# Patient Record
Sex: Female | Born: 2000 | Race: Black or African American | Hispanic: No | Marital: Single | Smoking: Never smoker
Health system: Southern US, Community
[De-identification: ages and names within clinical notes are randomized; demographics above are authoritative.]

## PROBLEM LIST (undated history)

## (undated) DIAGNOSIS — H539 Unspecified visual disturbance: Secondary | ICD-10-CM

## (undated) DIAGNOSIS — F902 Attention-deficit hyperactivity disorder, combined type: Secondary | ICD-10-CM

## (undated) DIAGNOSIS — F419 Anxiety disorder, unspecified: Secondary | ICD-10-CM

## (undated) DIAGNOSIS — F3160 Bipolar disorder, current episode mixed, unspecified: Secondary | ICD-10-CM

## (undated) DIAGNOSIS — F32A Depression, unspecified: Secondary | ICD-10-CM

## (undated) DIAGNOSIS — F329 Major depressive disorder, single episode, unspecified: Secondary | ICD-10-CM

---
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
## 2024-06-21 ENCOUNTER — Ambulatory Visit (HOSPITAL_COMMUNITY)
Admission: EM | Admit: 2024-06-21 | Discharge: 2024-06-22 | Disposition: A | Discharge status: Home / Self Care | Attending: Student in an Organized Health Care Education/Training Program | Admitting: Student in an Organized Health Care Education/Training Program

## 2024-06-21 DIAGNOSIS — F330 Major depressive disorder, recurrent, mild: Secondary | ICD-10-CM

## 2024-06-21 DIAGNOSIS — Z79899 Other long term (current) drug therapy: Secondary | ICD-10-CM | POA: Insufficient documentation

## 2024-06-21 DIAGNOSIS — F331 Major depressive disorder, recurrent, moderate: Secondary | ICD-10-CM | POA: Insufficient documentation

## 2024-06-21 DIAGNOSIS — Z760 Encounter for issue of repeat prescription: Secondary | ICD-10-CM | POA: Insufficient documentation

## 2024-06-21 DIAGNOSIS — Z5900 Homelessness unspecified: Secondary | ICD-10-CM | POA: Insufficient documentation
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
## 2024-06-22 ENCOUNTER — Emergency Department (HOSPITAL_COMMUNITY)
Admission: EM | Admit: 2024-06-22 | Discharge: 2024-06-22 | Disposition: A | Discharge status: Home / Self Care | Payer: Self-pay | Attending: Emergency Medicine | Admitting: Emergency Medicine

## 2024-06-22 ENCOUNTER — Encounter (HOSPITAL_COMMUNITY): Payer: Self-pay

## 2024-06-22 ENCOUNTER — Other Ambulatory Visit: Payer: Self-pay

## 2024-06-22 DIAGNOSIS — F4321 Adjustment disorder with depressed mood: Secondary | ICD-10-CM

## 2024-06-22 DIAGNOSIS — E876 Hypokalemia: Secondary | ICD-10-CM | POA: Insufficient documentation

## 2024-06-22 DIAGNOSIS — R45851 Suicidal ideations: Secondary | ICD-10-CM | POA: Diagnosis not present

## 2024-06-22 LAB — I-STAT CHEM 8, ED
BUN: 6 mg/dL (ref 6–20)
Calcium, Ion: 1.16 mmol/L (ref 1.15–1.40)
Chloride: 102 mmol/L (ref 98–111)
Creatinine, Ser: 1.1 mg/dL — ABNORMAL HIGH (ref 0.44–1.00)
Glucose, Bld: 103 mg/dL — ABNORMAL HIGH (ref 70–99)
HCT: 43 % (ref 36.0–46.0)
Hemoglobin: 14.6 g/dL (ref 12.0–15.0)
Potassium: 3 mmol/L — ABNORMAL LOW (ref 3.5–5.1)
Sodium: 140 mmol/L (ref 135–145)
TCO2: 21 mmol/L — ABNORMAL LOW (ref 22–32)

## 2024-06-22 LAB — COMPREHENSIVE METABOLIC PANEL WITH GFR
ALT: 14 U/L (ref 0–44)
AST: 25 U/L (ref 15–41)
Albumin: 4 g/dL (ref 3.5–5.0)
Alkaline Phosphatase: 86 U/L (ref 38–126)
Anion gap: 14 (ref 5–15)
BUN: 7 mg/dL (ref 6–20)
CO2: 20 mmol/L — ABNORMAL LOW (ref 22–32)
Calcium: 9.2 mg/dL (ref 8.9–10.3)
Chloride: 102 mmol/L (ref 98–111)
Creatinine, Ser: 1.05 mg/dL — ABNORMAL HIGH (ref 0.44–1.00)
GFR, Estimated: >60 mL/min (ref >60)
Glucose, Bld: 101 mg/dL — ABNORMAL HIGH (ref 70–99)
Potassium: 3.2 mmol/L — ABNORMAL LOW (ref 3.5–5.1)
Sodium: 136 mmol/L (ref 135–145)
Total Bilirubin: 1.1 mg/dL (ref 0.0–1.2)
Total Protein: 7.4 g/dL (ref 6.5–8.1)

## 2024-06-22 LAB — CBC
HCT: 41 % (ref 36.0–46.0)
Hemoglobin: 13.7 g/dL (ref 12.0–15.0)
MCH: 30.6 pg (ref 26.0–34.0)
MCHC: 33.4 g/dL (ref 30.0–36.0)
MCV: 91.7 fL (ref 80.0–100.0)
Platelets: 208 K/uL (ref 150–400)
RBC: 4.47 MIL/uL (ref 3.87–5.11)
RDW: 13.8 % (ref 11.5–15.5)
WBC: 9.4 K/uL (ref 4.0–10.5)
nRBC: 0 % (ref 0.0–0.2)

## 2024-06-22 LAB — RAPID URINE DRUG SCREEN, HOSP PERFORMED
Amphetamines: NOT DETECTED
Barbiturates: NOT DETECTED
Benzodiazepines: NOT DETECTED
Cocaine: NOT DETECTED
Opiates: NOT DETECTED
Tetrahydrocannabinol: NOT DETECTED

## 2024-06-22 LAB — HCG, SERUM, QUALITATIVE: Preg, Serum: NEGATIVE

## 2024-06-22 LAB — ETHANOL: Alcohol, Ethyl (B): <15 mg/dL (ref <15)

## 2024-06-22 NOTE — ED Notes (Signed)
 Pt changed into paper scrubs and non-skid socks. All patients belongings inventoried and moved to locker 4 in purple zone. Pt wanded by security in triage. Pt remains in triage room 2 at this time.

## 2024-06-22 NOTE — ED Notes (Signed)
 [employer] called to transport pt to a shelter. Dc instructions provided to pt.

## 2024-06-22 NOTE — Consult Note (Signed)
 [facility] Psychiatric Consult Initial  Patient Name: .Beth Riley  MRN: [id number]  DOB: Jul 27, 2001  Consult Order details:  Orders (From admission, onward)     Start     Ordered   06/22/24 0622  CONSULT TO CALL ACT TEAM       Ordering Provider: Bobette Pleasant SAUNDERS, PA-C  Provider:  (Not yet assigned)  Question:  Reason for Consult?  Answer:  suicidal   06/22/24 0621             Mode of Visit: In person    Psychiatry Consult Evaluation  Service Date: June 22, 2024 LOS:  LOS: 0 days  Chief Complaint I fell alone and no one was helping me  Primary Psychiatric Diagnoses  Adjustment disorder with depressed mood 2.  Suicidal ideations   Assessment  Beth A Schuermann is a 23 y.o. female admitted: Presented to the EDfor 06/22/2024  2:59 AM for suicidal ideations in the context of homelessness. She carries the psychiatric diagnoses of bipolar 1 disorder, depression, anxiety, and ADHD and no significant medical history.  Her current presentation of depression and suicidal ideations in the context of homelessness is most consistent with adjustment disorder with depressed mood. She does not meets criteria for inpatient psychiatric admission based on current presentation.  She currently takes no outpatient psychiatric medicines and she has no psychiatric services in place  Of note patient presented to [facility] 06/21/2024 with similar presentation and was discharged with resources as she did not meet criteria for inpatient psychiatric admission.  On initial examination, patient patient is observed laying in her bed awake.  She is pleasant and cooperative.  She has normal speech and behavior.  She expresses her frustration that she has been homeless for the past few months.  She has been staying back-and-forth at different family members houses and shelters.  Last night she was unable to find anywhere to go and began to feel alone.  She began to have intermittent suicidal ideations  with no intent or plan.  She is currently denying any suicidal or homicidal ideations.  She is able to verbally contract for safety.  She has no access to firearms/weapons.  She is currently denying any depression or anxiety.  She has a euthymic affect.  Reports she only feels depressed and suicidal when she has nowhere to stay.  She is requesting admission to be restarted back on her psychiatric medications which she has not taken in quite some time.  She also wants help switching over her Medicaid because it is currently out of [place] and she is requesting help with housing.  Explained multiple outpatient resources with patient and she verbalized understanding. She is denying any homicidal ideations or auditory/visual hallucinations.  She does not appear manic, psychotic, delusional, or paranoid.  She does not appear to be responding to internal/external stimuli.  Patient initially presented reporting suicidal ideation in the context of homelessness.  On reassessment patient had denies suicidal thoughts, plan, or intent.  Patient expresses primary concern related to housing instability and request community resources.  No evidence of acute psychiatric emergency identified at this time.  Patient does not meet criteria for inpatient psychiatric admission.  Resources  provided and patient is psychiatrically cleared for discharge.  Please see plan below for detailed recommendations.   Diagnoses:  Active Hospital problems: Principal Problem:   Adjustment disorder with depressed mood Active Problems:   Suicidal ideation    Plan   ## Psychiatric Medication Recommendations:  -None  at this time  ## Medical Decision Making Capacity: Not specifically addressed in this encounter  ## Further Work-up:  -- None at this time  -- most recent EKG on 11/09/2016 had QtC of 429 -- Pertinent labwork reviewed earlier this admission includes: C MP glucose, BAL<15, UDS negative, CBC,Pregnancy negative,  ##  Disposition:-- There are no psychiatric contraindications to discharge at this time  ## Behavioral / Environmental: - No specific recommendations at this time.     ## Safety and Observation Level:  - Based on my clinical evaluation, I estimate the patient to be at low risk of self harm in the current setting. - At this time, we recommend  routine. This decision is based on my review of the chart including patient's history and current presentation, interview of the patient, mental status examination, and consideration of suicide risk including evaluating suicidal ideation, plan, intent, suicidal or self-harm behaviors, risk factors, and protective factors. This judgment is based on our ability to directly address suicide risk, implement suicide prevention strategies, and develop a safety plan while the patient is in the clinical setting. Please contact our team if there is a concern that risk level has changed.  CSSR Risk Category:C-SSRS RISK CATEGORY: High Risk  Suicide Risk Assessment: Patient has following modifiable risk factors for suicide: medication noncompliance and triggering events, which we are addressing by recommending outpatient follow-up with medication management and therapy. . Patient has following non-modifiable or demographic risk factors for suicide: history of suicide attempt, history of self harm behavior, and psychiatric hospitalization Patient has the following protective factors against suicide: Access to outpatient mental health care and Frustration tolerance  Thank you for this consult request. Recommendations have been communicated to the primary team.  We will recommend psychiatric outpatient follow-up at this time.   Elveria VEAR Batter, NP       History of Present Illness  Relevant Aspects of Hospital ED Course:  Admitted on 06/22/2024 for suicidal ideations in the context of homelessness. She carries the psychiatric diagnoses of bipolar 1 disorder, depression,  anxiety, and ADHD and no significant medical history.  Patient Report:  I fell alone and no one was helping me  Rebeka Sponseller PA-C, Jeannette A Kraemer is a 23 y.o. female who states that she is in a unpredictable situation where she is not able to get any help.  Patient will not expound on this at this provider.  States that she feels hopeless like there is no point for living.  Does have a history of suicide attempt and ideation in the past.  Will not endorse a plan to this provider at this time, denies homicidality or AVH.  History of bipolar 1 and GAD.  She is not taking her fluoxetine or hydroxyzine as prescribed per patient.  I do not see either of these medications listed in her medical record, but last confirmed medications in our system were in 2018.   RN TRIAGE NOTE, Patient states she started feeling suicidal a few hours a go. State she is in a situation where she feels like she cannot get any help. Patient states there are a lot of weird things going on. She tried to contact the police but they have not helped her. Patient has had suicidal thought in the pass.  Psych ROS:  Depression: Currently is denying any depression- Anxiety: Currently denying any depression Mania (lifetime and current): Endorses Psychosis: (lifetime and current): Unsure  Collateral information:  None  Review of Systems  Constitutional:  Negative  for chills and fever.  Respiratory:  Negative for cough.   Gastrointestinal:  Negative for nausea and vomiting.  Neurological:  Negative for tremors and seizures.  Psychiatric/Behavioral: Negative.       Psychiatric and Social History  Psychiatric History:  Information collected from chart review and patient  Prev Dx/Sx: Bipolar 1 and depression, ADHD, anxiety Current Psych Provider: None Home Meds (current): None has been off medicines for quite some time Previous Med Trials: Risperdal , Haldol, Cogentin , Wellbutrin , Prozac, and Zyprexa  Therapy: None  in place  Prior Psych Hospitalization: Multiple psychiatric admissions as an adolescent Prior Self Harm: History of self-harm/cutting has not been 17 months-history of SI As adolescent. Prior Violence: Denies  Family Psych History: Mother has bipolar disorder and alcoholism Family Hx suicide: Denies  Social History:  Developmental Hx: Denies Educational Hx: 12th grade Occupational Hx: Unemployed Legal Hx: Denies Living Situation: Homeless for months has been living with different family members but yesterday she could find no one to stay with Spiritual Hx: Denies Access to weapons/lethal means: Denies  Substance History Denies all substance use Exam Findings  Physical Exam:  Vital Signs:  Temp:  [97.8 F (36.6 C)-98.6 F (37 C)] 97.9 F (36.6 C) (09/14 0918) Pulse Rate:  [85-96] 85 (09/14 0918) Resp:  [16-21] 18 (09/14 0918) BP: (120-128)/(85-93) 120/85 (09/14 0918) SpO2:  [98 %-100 %] 100 % (09/14 0918) Blood pressure 120/85, pulse 85, temperature 97.9 F (36.6 C), temperature source Oral, resp. rate 18, height 5' 2 (1.575 m), last menstrual period 06/17/2024, SpO2 100%. There is no height or weight on file to calculate BMI.  Physical Exam Constitutional:      Appearance: Normal appearance.  Pulmonary:     Effort: Pulmonary effort is normal. No respiratory distress.  Musculoskeletal:     Cervical back: Normal range of motion.  Neurological:     Mental Status: She is alert and oriented to person, place, and time.  Psychiatric:        Attention and Perception: Attention and perception normal.        Mood and Affect: Mood and affect normal.        Speech: Speech normal.        Behavior: Behavior normal. Behavior is cooperative.        Thought Content: Thought content normal.        Cognition and Memory: Cognition normal.        Judgment: Judgment normal.     Mental Status Exam: General Appearance: Casual  Orientation:  Full (Time, Place, and Person)  Memory:   Immediate;   Good Recent;   Good Remote;   Good  Concentration:  Concentration: Good and Attention Span: Good  Recall:  Good  Attention  Good  Eye Contact:  Good  Speech:  Clear and Coherent and Normal Rate  Language:  Good  Volume:  Normal  Mood: I feel good now  Affect:  Congruent  Thought Process:  Coherent  Thought Content:  Logical  Suicidal Thoughts:  No  Homicidal Thoughts:  No  Judgement:  Good  Insight:  Good  Psychomotor Activity:  Normal  Akathisia:  No  Fund of Knowledge:  Good      Assets:  Communication Skills Desire for Improvement Leisure Time Physical Health Resilience  Cognition:  WNL  ADL's:  Intact  AIMS (if indicated):        Other History   These have been pulled in through the EMR, reviewed, and updated if appropriate.  Family  History:  The patient's family history is not on file.  Medical History: Past Medical History:  Diagnosis Date   ADHD (attention deficit hyperactivity disorder), combined type 05/26/2014   Anxiety    Bipolar 1 disorder, mixed (HCC) 11/20/2016   Depression    Vision abnormalities     Surgical History: History reviewed. No pertinent surgical history.   Medications:  No current facility-administered medications for this encounter.  Current Outpatient Medications:    benztropine  (COGENTIN ) 1 MG tablet, Take 1 tablet (1 mg total) by mouth 2 (two) times daily., Disp: 60 tablet, Rfl: 0   buPROPion  (WELLBUTRIN  XL) 300 MG 24 hr tablet, Take 1 tablet (300 mg total) by mouth daily., Disp: 30 tablet, Rfl: 0   desmopressin  (DDAVP ) 0.2 MG tablet, Take 3 tablets (0.6 mg total) by mouth at bedtime., Disp: 90 tablet, Rfl: 0   lithium  carbonate 150 MG capsule, Take 3 capsules (450 mg total) by mouth 2 (two) times daily with a meal., Disp: 180 capsule, Rfl: 0   OLANZapine  (ZYPREXA ) 10 MG tablet, Take 1 tablet (10 mg total) by mouth 2 (two) times daily. Take 1 tablet by mouth daily at 0800 and 1 tablet by mouth daily at bedtime,  Disp: 60 tablet, Rfl: 0  Allergies: Allergies  Allergen Reactions   Penicillins     Unknown reaction     Elveria VEAR Batter, NP

## 2024-06-22 NOTE — ED Provider Notes (Signed)
 [place] EMERGENCY DEPARTMENT AT [facility] Provider Note   CSN: [id number] Arrival date & time: 06/22/24  0241     Patient presents with: Suicidal   Beth Riley is a 23 y.o. female who states that she is in a unpredictable situation where she is not able to get any help.  Patient will not expound on this at this provider.  States that she feels hopeless like there is no point for living.  Does have a history of suicide attempt and ideation in the past.  Will not endorse a plan to this provider at this time, denies homicidality or AVH.  History of bipolar 1 and GAD.  She is not taking her fluoxetine or hydroxyzine as prescribed per patient.  I do not see either of these medications listed in her medical record, but last confirmed medications in our system were in 2018.   HPI     Prior to Admission medications   Medication Sig Start Date End Date Taking? Authorizing Provider  benztropine  (COGENTIN ) 1 MG tablet Take 1 tablet (1 mg total) by mouth 2 (two) times daily. 11/27/16   Thomas, Lashunda, NP  buPROPion  (WELLBUTRIN  XL) 300 MG 24 hr tablet Take 1 tablet (300 mg total) by mouth daily. 11/28/16   Thomas, Lashunda, NP  desmopressin  (DDAVP ) 0.2 MG tablet Take 3 tablets (0.6 mg total) by mouth at bedtime. 11/27/16   Thomas, Lashunda, NP  lithium  carbonate 150 MG capsule Take 3 capsules (450 mg total) by mouth 2 (two) times daily with a meal. 11/27/16   Debby Fortes, NP  OLANZapine  (ZYPREXA ) 10 MG tablet Take 1 tablet (10 mg total) by mouth 2 (two) times daily. Take 1 tablet by mouth daily at 0800 and 1 tablet by mouth daily at bedtime 11/27/16   Thomas, Lashunda, NP    Allergies: Penicillins    Review of Systems  Psychiatric/Behavioral:  Positive for dysphoric mood and suicidal ideas. Negative for hallucinations.     Updated Vital Signs BP (!) 128/91 (BP Location: Right Arm)   Pulse 96   Temp 97.8 F (36.6 C)   Resp (!) 21   Ht 5' 2 (1.575 m)   LMP 06/17/2024  (Approximate)   SpO2 100%   Physical Exam Vitals and nursing note reviewed.  Constitutional:      Appearance: She is obese. She is not ill-appearing or toxic-appearing.  HENT:     Head: Normocephalic and atraumatic.  Eyes:     General: No scleral icterus.       Right eye: No discharge.        Left eye: No discharge.     Conjunctiva/sclera: Conjunctivae normal.  Cardiovascular:     Heart sounds: Normal heart sounds. No murmur heard. Pulmonary:     Effort: Pulmonary effort is normal.  Abdominal:     Palpations: Abdomen is soft.  Musculoskeletal:     Right lower leg: No edema.     Left lower leg: No edema.  Skin:    General: Skin is warm and dry.     Capillary Refill: Capillary refill takes less than 2 seconds.  Neurological:     General: No focal deficit present.     Mental Status: She is alert.  Psychiatric:        Attention and Perception: Attention normal.        Mood and Affect: Mood normal.        Speech: Speech normal.        Behavior: Behavior  is cooperative.        Thought Content: Thought content includes suicidal ideation. Thought content does not include homicidal ideation. Thought content does not include suicidal plan.     Comments: Does not appear to be responding to internal stimuli.     (all labs ordered are listed, but only abnormal results are displayed) Labs Reviewed  COMPREHENSIVE METABOLIC PANEL WITH GFR - Abnormal; Notable for the following components:      Result Value   Potassium 3.2 (*)    CO2 20 (*)    Glucose, Bld 101 (*)    Creatinine, Ser 1.05 (*)    All other components within normal limits  I-STAT CHEM 8, ED - Abnormal; Notable for the following components:   Potassium 3.0 (*)    Creatinine, Ser 1.10 (*)    Glucose, Bld 103 (*)    TCO2 21 (*)    All other components within normal limits  SARS CORONAVIRUS 2 BY RT PCR  ETHANOL  CBC  RAPID URINE DRUG SCREEN, HOSP PERFORMED  HCG, SERUM, QUALITATIVE    EKG: None  Radiology: No  results found.   Procedures   Medications Ordered in the ED - No data to display                                  Medical Decision Making 24 year old female presents with concern for suicidality.  Hypertension and tachycardia on intake, mild.  Resolved at time of my arrival to the bedside.  Patient is well-appearing, cooperative.  Reassuring cardiopulmonary exam; does not apear to be responding to internal stimuli  Amount and/or Complexity of Data Reviewed Labs: ordered.    Details: CBC unremarkable, CMP with hypokalemia of 3.2 repleted orally.  UDS negative.     Patient is medically cleared pending TTS evaluation at this time. Patient remains voluntary at this time; no indication for IVC.   Care of this patient signed out to oncoming ED provider Theotis, PA-C at time of shift change. Pending TTS at time of shift change. All pertinent HPI, physical exam, and laboratory findings were discussed with them prior to my departure. Disposition of patient pending completion of workup, reevaluation, and clinical judgement of oncoming ED provider.   This chart was dictated using voice recognition software, Dragon. Despite the best efforts of this provider to proofread and correct errors, errors may still occur which can change documentation meaning.       Final diagnoses:  Suicidal ideation    ED Discharge Orders     None          Bobette Pleasant JONELLE DEVONNA 06/22/24 9357    Bari Charmaine FALCON, MD 06/22/24 [phone]

## 2024-06-22 NOTE — ED Provider Notes (Signed)
  Physical Exam  BP 120/85   Pulse 85   Temp 97.9 F (36.6 C) (Oral)   Resp 18   Ht 5' 2 (1.575 m)   LMP 06/17/2024 (Approximate)   SpO2 100%   Physical Exam Vitals and nursing note reviewed.  Constitutional:      Appearance: Normal appearance.  HENT:     Head: Normocephalic and atraumatic.  Eyes:     General:        Right eye: No discharge.        Left eye: No discharge.     Conjunctiva/sclera: Conjunctivae normal.  Pulmonary:     Effort: Pulmonary effort is normal.  Skin:    General: Skin is warm and dry.     Findings: No rash.  Neurological:     General: No focal deficit present.     Mental Status: She is alert.  Psychiatric:        Mood and Affect: Mood normal.        Behavior: Behavior normal.     Procedures  Procedures  ED Course / MDM    Medical Decision Making Amount and/or Complexity of Data Reviewed Labs: ordered.   Accepted handoff at shift change from Sponseller PA-C. Please see prior provider note for more detail.   Briefly: Patient is 23 y.o. female who presents to the emergency department today for further evaluation of suicidal ideation without any particular plan.  She has had an attempt in the past.  She states that she is in an unpredictable situation where she is not able to get any help.  DDX: concern for suicidal ideation  Plan: Discharge home.  She talked with TTS and her psychiatric team she does not meet inpatient criteria at this time.  She is cleared medically and from our psychiatric team.  Resources provided.  She is safe for discharge at this time.           Theotis Peers [place], PA-C 06/22/24 1407    Dasie Faden, MD 06/24/24 [phone]

## 2024-06-22 NOTE — ED Notes (Signed)
 Pt provided with sandwich bag and water, no other needs expressed at this time.

## 2024-06-22 NOTE — ED Notes (Signed)
 Pt refused COVID swab. Pt given all of her belongings. Pt ambulated from ed with steady gait.

## 2024-06-22 NOTE — ED Provider Notes (Signed)
 Behavioral Health Urgent Care Medical Screening Exam  Patient Name: Beth Riley MRN: [id number] Date of Evaluation: 06/22/24 Chief Complaint:   Diagnosis:  Final diagnoses:  Medication refill  MDD (major depressive disorder), recurrent episode, moderate (HCC)    History of Present illness: Beth Riley is a 23 y.o. female with a history of bipolar disorder and anxiety who voluntarily presented to the [facility] seeking medication refill.   Patient seen face to face and her chart reviewed by this NP. On exam, patient reports being prescribed Duloxetine and Hydroxyzine but ran out of her medications two months ago. She says she does not have an outpatient provider at this time. she says she has no social support and has been homeless for about 2 months. Despite this, she denies any worsening depressive symptoms and has no current suicidal ideation homicidal ideation, paranoia, hallucinations, or substance use.  She is alert and oriented x4, calm and cooperative. Her speech is clear, coherent, and of normal rate and volume. Her mood is anxious, affect is congruent. Thought processes are logical and goal-directed, with no signs of delusions or perceptual disturbances. Insight and judgment appear intact. She denies suicidal or homicidal ideations.  Patient reports stable mood and denies any acute distress or worsening symptoms, including SI, HI, paranoia, or hallucinations. The patient has no current outpatient psychiatric provider and is currently homeless. Patient provided with information about available outpatient resources, including [facility] services here at [facility] and local shelters. She verbalized understanding of these resources and was encouraged to come to [facility] Mon-Friday to establish outpatient psychiatric services.   Flowsheet Row ED from 06/21/2024 in [facility]  C-SSRS RISK CATEGORY Moderate Risk    Psychiatric Specialty Exam  Presentation   General Appearance:No data recorded Eye Contact:No data recorded Speech:No data recorded Speech Volume:No data recorded Handedness:No data recorded  Mood and Affect  Mood:No data recorded Affect:No data recorded  Thought Process  Thought Processes:No data recorded Descriptions of Associations:No data recorded Orientation:No data recorded Thought Content:No data recorded   Hallucinations:No data recorded Ideas of Reference:No data recorded Suicidal Thoughts:No data recorded Homicidal Thoughts:No data recorded  Sensorium  Memory:No data recorded Judgment:No data recorded Insight:No data recorded  Executive Functions  Concentration:No data recorded Attention Span:No data recorded Recall:No data recorded Fund of Knowledge:No data recorded Language:No data recorded  Psychomotor Activity  Psychomotor Activity:No data recorded  Assets  Assets:No data recorded  Sleep  Sleep:No data recorded Number of hours: No data recorded  Physical Exam: Physical Exam Vitals and nursing note reviewed.  Constitutional:      General: She is not in acute distress.    Appearance: She is well-developed.  HENT:     Head: Normocephalic and atraumatic.  Eyes:     Conjunctiva/sclera: Conjunctivae normal.  Cardiovascular:     Rate and Rhythm: Normal rate.     Heart sounds: No murmur heard. Pulmonary:     Effort: Pulmonary effort is normal.  Abdominal:     Palpations: Abdomen is soft.  Musculoskeletal:        General: Normal range of motion.     Cervical back: Normal range of motion.  Skin:    Capillary Refill: Capillary refill takes less than 2 seconds.     Coloration: Skin is not jaundiced.  Neurological:     Mental Status: She is alert and oriented to person, place, and time.  Psychiatric:        Attention and Perception: Attention and perception normal.  Mood and Affect: Mood normal.        Speech: Speech normal.        Behavior: Behavior normal. Behavior is  cooperative.        Thought Content: Thought content normal.        Cognition and Memory: Cognition normal.    Review of Systems  Constitutional: Negative.   HENT: Negative.    Eyes: Negative.   Respiratory: Negative.    Cardiovascular: Negative.   Gastrointestinal: Negative.   Genitourinary: Negative.   Musculoskeletal: Negative.   Skin: Negative.   Neurological: Negative.   Endo/Heme/Allergies: Negative.   Psychiatric/Behavioral:  Positive for depression.    Blood pressure (!) 122/93, pulse 85, temperature 98.6 F (37 C), temperature source Oral, resp. rate 16, SpO2 98%. There is no height or weight on file to calculate BMI.  Musculoskeletal: Strength & Muscle Tone: within normal limits Gait & Station: normal Patient leans: Right   [facility] MSE Discharge Disposition for Follow up and Recommendations: Based on my evaluation the patient does not appear to have an emergency medical condition and can be discharged with resources and follow up care in outpatient services for Medication Management, Individual Therapy, and Group Therapy   Patient reports stable mood and denies any acute distress or worsening symptoms, including SI, HI, paranoia, or hallucinations. The patient has no current outpatient psychiatric provider and is currently homeless. Patient provided with information about available outpatient resources, including [facility] services here at [facility] and local shelters. She verbalized understanding of these resources and was encouraged to come to [facility] Mon-Friday to establish outpatient psychiatric services.   Kathryne DELENA Show, NP 06/22/2024, 12:13 AM

## 2024-06-22 NOTE — ED Notes (Signed)
 Received PC from [employer], officer states they will not transport pt back to downtown. Provider updated.   Officer arrived to this facility and stated that pt has been banned from [place] station and he will not transport pt back to downtown. He states  she can walk around in this area instead of downtown since no shelters are open.  Spoke with pt and informed her that [employer] would not be picking her up and that she has outpt resources and a list of homeless shelters to follow up with during the day.   Pt stated she is in dire need and could not get help in [place] and that is why she came to [facility].  Instructed that she did not meet criteria for inpt and she has the resources to follow up with during the day.  She voices understanding.   Pt left the facility.

## 2024-06-22 NOTE — Discharge Instructions (Signed)
 Based on what you have shared, a list of resources for outpatient therapy and psychiatry is provided below to get you started back on treatment.  It is imperative that you follow through with treatment within 5-7 days from the day of discharge to prevent any further risk to your safety or mental well-being.  You are not limited to the list provided.  In case of an urgent crisis, you may contact the Mobile Crisis Unit with [facility] at [phone].        Outpatient Services for Therapy and Medication Management for [facility] [address], [place], [address] [phone] phone  New Patient Assessment/Therapy Walk-ins Monday and Wednesday: 8am until slots are full. Every 1st and 2nd Friday: 1pm - 5pm  NO ASSESSMENT/THERAPY WALK-INS ON TUESDAYS OR THURSDAYS  New Patient Psychiatry/Medication Management Walk-ins Monday-Friday: 7:15 am   For all walk-ins, we ask that you arrive by 7:15 am because patient will be seen in the order of arrival.  Availability is limited; therefore, you may not be seen on the same day that you walk-in.  Our goal is to serve and meet the needs of our community to the best of our ability.   [facility] [address] [place], [place], [address] [phone] phone  [facility] [address] [place], [place], [address] [phone] phone [phone] phone ([address], Anthem/Elevance, [address], [place], [address], [address], Healthy [place], [place], [place], [address], [employer]ocoPhillips, [place], UHC, American Financial, [place], Out of Network)  [facility], [place] [address] [place], [place], [address] [phone] phone ([place], Anthem/Elevance, Sanmina-SCI Options/Carelon, BCBS, [address], [place], [place], [place], [place], [employer], [place], [place], [place], [facility], [facility])  [facility] [address] [place], [place], [address] [phone] phone (Medicaid, ask about other insurance)  [facility] [address] [place], [place], [address] [phone] phone [phone] fax ([address], [place] , [place], [place], [place] Health Choice, UHC, General Electric, Self-Pay)  Beth Riley [address] [place], [place], [address] [phone] phone ([address] Anthem/Elevance, [address], [address], [place], CSX Corporation, [place], [place], [place], Har[employer] Entertainment, [place], [place], [place], [facility])  [facility] - 6-8 MONTH WAIT FOR THERAPY; SOONER FOR MEDICATION MANAGEMENT [address] [place], [place], [address] [phone] phone ([address] AmeriHealth [address] - , [address], [place], [place], Friday Health Plans, [address], BCBS Healthy [place], [place], [address], [place], [place], [place], [place], Tricare, UHC, Safeco Corporation, [place])  [facility] [address], [address] [place], [place], [address] [phone] phone  [facility] [address] [place], [place], [address] [phone] phone  [facility] [address], [address] [place], [place], [address] [phone] phone  [facility] of the [facility] - THERAPY ONLY [address] [place], [place], [address] [phone] phone  [facility], [place] [address], [place], [address] [phone] phone  [facility] [address] [place], [place], [address] [phone] phone [phone] fax  [facility] [address] [place], [place], [address] [phone] phone [phone] fax  [facility] [phone] N[address] [place], [place], [address] [phone] phone  Beth Riley [address] [place], [place], [address]  [phone] phone  [facility]  (Adults Only) [address] [place], [place], [address]  [phone] phone [phone] fax   [facility]  [address], [place], [address] [phone] phone  Offers food and emergency or transitional housing to men, women, or families in need. Clients participate in programs and workshops developed to promote self-sufficiency and personal development.Call or walk in. Applications are accepted Monday, Wednesday, and Friday by appointment only. Need photo ID and proof of income.  [facility] [address], [place], [place] [address] [phone] Population served: Adult men & women (63 years old and older, able to perform activities for daily living) Documents required: Valid ID & Social Security Card  [facility] [address] [place], [place]  [address] [phone] Population served: Families with children  [facility] [address] [place], [place]  [address] [phone] Population served: Single women 18+ without dependents Documents required:  Valid ID & Social Security Card  [facility] [address] [place], [place]  [address] [phone] Population served: Female veterans 18+ with substance abuse/mental health issues Eligibility: By referral only  [facility] [address], [place], [place] [address] [phone] Population served: Males 18+ Documents required: Valid ID & Social Security Card  [facility] at [employer] [facility] [place], [employer]. [address] [place] [address] [phone] or [phone] Population served: Pregnant women with or without children  Documents required: Valid ID & Social Security Ship broker of [employer] [address], [place], [place] [address] [phone] Population Served: Families with children  [facility] [address] [place], [place] [address] [phone] Population served: Men 18+, preference for disabled and/or veterans Eligibility: By referral only  [facility]) - [facility] [address] [name], [place], [place] [address] [phone] or [phone] Population served: Families with children.    WOMEN ONLY  The Shelter serves up to 20 women each night. Open from December 11th through the end of March in the evenings from 5:30 pm until 7:30 am, the Shelter provides a hot evening meal, shower and sleeping facilities, and food for the next day. Secure parking is available beside the building.     Shelter Address   Directions [facility] [place] Shelter [address] [place], [place]  If you are in need of housing through the shelter, contact the [employer] at [phone].

## 2024-06-22 NOTE — ED Triage Notes (Signed)
 Patient states she started feeling suicidal a few hours a go. State she is in a situation where she feels like she cannot get any help. Patient states there are a lot of weird things going on. She tried to contact the police but they have not helped her. Patient has had suicidal thought in the pass.
